# Patient Record
Sex: Male | Born: 2000 | Hispanic: No | Marital: Single | Smoking: Current some day smoker
Health system: Southern US, Community
[De-identification: ages and names within clinical notes are randomized; demographics above are authoritative.]

---
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
## 2021-09-05 ENCOUNTER — Ambulatory Visit
Admission: EM | Admit: 2021-09-05 | Discharge: 2021-09-05 | Disposition: A | Discharge status: Home / Self Care | Payer: Self-pay | Attending: Internal Medicine | Admitting: Internal Medicine

## 2021-09-05 DIAGNOSIS — L700 Acne vulgaris: Secondary | ICD-10-CM

## 2021-09-05 DIAGNOSIS — L509 Urticaria, unspecified: Secondary | ICD-10-CM

## 2021-09-05 MED ORDER — CLINDAMYCIN HCL 150 MG PO CAPS: 450.0000 mg | ORAL_CAPSULE | Freq: Three times a day (TID) | ORAL | 0 refills | Status: AC

## 2021-09-05 NOTE — ED Triage Notes (Signed)
Patient presents to Urgent Care with complaints of itchy all over with rash on arms, neck back and legs since Thursday. Patient reports taking claratin today and about 1 hour after taking medication pt experienced numbness in arms and legs.  ? ?

## 2021-09-05 NOTE — ED Provider Notes (Signed)
?[facility] ? ? ? ?CSN: [id number] ?Arrival date & time: 09/05/21  1349 ? ? ?  ? ?History   ?Chief Complaint ?Chief Complaint  ?Patient presents with  ? Pruritis  ? ? ?HPI ?Robert Hale is a 21 y.o. male.  ? ?Patient presents with itchy rash that is present on bilateral arms, neck, back that started approximately 3 days ago.  Patient has history of body acne and is concerned for a flareup.  Patient took Claritin today with no improvement and also reports that he started having some paresthesias in hands about an hour after taking medication.  Paresthesias have improved.  Denies any changes in the environment including lotions, soaps, detergents, foods, etc.  Patient has also been taking a medication that he bought online called clear stem skin care to help alleviate acne.  He has been followed by dermatology in the past but has not been to them in a while.  Patient states that he has been told that the clear stem skin care is similar to Accutane. ? ? ? ?History reviewed. No pertinent past medical history. ? ?There are no problems to display for this patient. ? ? ?Past Surgical History:  ?Procedure Laterality Date  ? ORIF HUMERUS FRACTURE Left 04/27/2015  ? Procedure: CLOSED REDUCTION AND PERCANTANEOUS PINNING LEFT PROXIMAL HUMERUS;  Surgeon: Beverely LowSteve Norris, MD;  Location: [facility] OR;  Service: Orthopedics;  Laterality: Left;  ? ? ? ? ? ?Home Medications   ? ?Prior to Admission medications   ?Medication Sig Start Date End Date Taking? Authorizing Provider  ?clindamycin (CLEOCIN) 150 MG capsule Take 3 capsules (450 mg total) by mouth 3 (three) times daily for 5 days. 09/05/21 09/10/21 Yes Tynasia Mccaul, Rolly SalterHaley E, FNP  ?benzoyl peroxide 5 % gel Apply topically See admin instructions. Apply to face and back 2 to 3 times daily    [provider]  ?doxycycline (VIBRAMYCIN) 100 MG capsule Take 100 mg by mouth 2 (two) times daily.    [provider]  ?loperamide (IMODIUM) 2 MG capsule Take 1 capsule (2 mg total) by  mouth every 8 (eight) hours as needed for diarrhea or loose stools. 11/14/14   Marcellina MillinGaley, Timothy, MD  ?oxyCODONE-acetaminophen (PERCOCET) 5-325 MG tablet Take 1 tablet by mouth every 4 (four) hours as needed for moderate pain. 04/27/15   Beverely LowNorris, Steve, MD  ? ? ?Family History ?History reviewed. No pertinent family history. ? ?Social History ?Social History  ? ?Tobacco Use  ? Smoking status: Some Days  ?  Types: Cigarettes  ?Substance Use Topics  ? Alcohol use: Yes  ?  Comment: Occassionally  ? Drug use: Not Currently  ?  Types: Marijuana  ? ? ? ?Allergies   ?Patient has no known allergies. ? ? ?Review of Systems ?Review of Systems ?Per HPI ? ?Physical Exam ?Triage Vital Signs ?ED Triage Vitals [09/05/21 1443]  ?Enc Vitals Group  ?   BP (!) 143/77  ?   Pulse Rate 71  ?   Resp 18  ?   Temp 97.9 ?F (36.6 ?C)  ?   Temp Source Oral  ?   SpO2 99 %  ?   Weight   ?   Height   ?   Head Circumference   ?   Peak Flow   ?   Pain Score   ?   Pain Loc   ?   Pain Edu?   ?   Excl. in GC?   ? ?No data found. ? ?Updated Vital  Signs ?BP (!) 143/77 (BP Location: Right Arm)   Pulse 71   Temp 97.9 ?F (36.6 ?C) (Oral)   Resp 18   SpO2 99%  ? ?Visual Acuity ?Right Eye Distance:   ?Left Eye Distance:   ?Bilateral Distance:   ? ?Right Eye Near:   ?Left Eye Near:    ?Bilateral Near:    ? ?Physical Exam ?Constitutional:   ?   General: He is not in acute distress. ?   Appearance: Normal appearance. He is not toxic-appearing or diaphoretic.  ?HENT:  ?   Head: Normocephalic and atraumatic.  ?Eyes:  ?   Extraocular Movements: Extraocular movements intact.  ?   Conjunctiva/sclera: Conjunctivae normal.  ?Cardiovascular:  ?   Rate and Rhythm: Normal rate and regular rhythm.  ?   Pulses: Normal pulses.  ?   Heart sounds: Normal heart sounds.  ?Pulmonary:  ?   Effort: Pulmonary effort is normal. No respiratory distress.  ?   Breath sounds: Normal breath sounds.  ?Musculoskeletal:  ?   Comments: Grip strength 5/5 to bilateral hands.  Neurovascular  intact.  ?Skin: ?   Comments: Pustular papular lesions present throughout right lateral neck, upper back, bilateral upper arms.   ?Neurological:  ?   General: No focal deficit present.  ?   Mental Status: He is alert and oriented to person, place, and time. Mental status is at baseline.  ?   Cranial Nerves: Cranial nerves 2-12 are intact.  ?   Sensory: Sensation is intact.  ?   Motor: Motor function is intact.  ?   Coordination: Coordination is intact.  ?   Gait: Gait is intact.  ?Psychiatric:     ?   Mood and Affect: Mood normal.     ?   Behavior: Behavior normal.     ?   Thought Content: Thought content normal.     ?   Judgment: Judgment normal.  ? ? ? ?UC Treatments / Results  ?Labs ?(all labs ordered are listed, but only abnormal results are displayed) ?Labs Reviewed - No data to display ? ?EKG ? ? ?Radiology ?No results found. ? ?Procedures ?Procedures (including critical care time) ? ?Medications Ordered in UC ?Medications - No data to display ? ?Initial Impression / Assessment and Plan / UC Course  ?I have reviewed the triage vital signs and the nursing notes. ? ?Pertinent labs & imaging results that were available during my care of the patient were reviewed by me and considered in my medical decision making (see chart for details). ? ?  ?It appears the patient be having an acne flareup.  Suspect the paresthesias of bilateral hands is due to Claritin adverse reaction and should resolve once medication has cleared from system.  Although, patient was advised to follow-up if symptoms persist or worsen.  Neuro exam is normal so no concern for emergent evaluation at the hospital at this time given the symptoms.  Further review of clear stem skin care reveals that it does not appear to be related to Accutane but will avoid doxycycline and treat with clindamycin at this time.  Patient advised to follow-up with dermatology for further evaluation and management as well.  Patient verbalized understanding and was  agreeable with plan. ?Final Clinical Impressions(s) / UC Diagnoses  ? ?Final diagnoses:  ?Acne vulgaris  ?Urticaria  ? ? ? ?Discharge Instructions   ? ?  ?You have been prescribed an antibiotic to help alleviate symptoms.  Please stop taking medication that you  have bought on the Internet.  Follow-up with dermatology for further evaluation and management. ? ? ? ?ED Prescriptions   ? ? Medication Sig Dispense Auth. Provider  ? clindamycin (CLEOCIN) 150 MG capsule Take 3 capsules (450 mg total) by mouth 3 (three) times daily for 5 days. 45 capsule Ervin Knack E, [place]  ? ?  ? ?PDMP not reviewed this encounter. ?  ?Gustavus Bryant, [place] ?09/05/21 1526 ? ?

## 2021-09-05 NOTE — Discharge Instructions (Signed)
You have been prescribed an antibiotic to help alleviate symptoms.  Please stop taking medication that you have bought on the Internet.  Follow-up with dermatology for further evaluation and management. ?

## 2022-03-03 ENCOUNTER — Emergency Department (HOSPITAL_COMMUNITY)
Admission: EM | Admit: 2022-03-03 | Discharge: 2022-03-04 | Disposition: A | Discharge status: Home / Self Care | Payer: Medicaid Other | Attending: Emergency Medicine | Admitting: Emergency Medicine

## 2022-03-03 ENCOUNTER — Encounter (HOSPITAL_COMMUNITY): Payer: Self-pay

## 2022-03-03 ENCOUNTER — Emergency Department (HOSPITAL_COMMUNITY): Payer: Medicaid Other

## 2022-03-03 ENCOUNTER — Other Ambulatory Visit: Payer: Self-pay

## 2022-03-03 DIAGNOSIS — M546 Pain in thoracic spine: Secondary | ICD-10-CM | POA: Insufficient documentation

## 2022-03-03 LAB — CBC
HCT: 41 % (ref 39.0–52.0)
Hemoglobin: 13 g/dL (ref 13.0–17.0)
MCH: 26.9 pg (ref 26.0–34.0)
MCHC: 31.7 g/dL (ref 30.0–36.0)
MCV: 84.7 fL (ref 80.0–100.0)
Platelets: 387 10*3/uL (ref 150–400)
RBC: 4.84 MIL/uL (ref 4.22–5.81)
RDW: 13.6 % (ref 11.5–15.5)
WBC: 12.5 10*3/uL — ABNORMAL HIGH (ref 4.0–10.5)
nRBC: 0 % (ref 0.0–0.2)

## 2022-03-03 LAB — BASIC METABOLIC PANEL
Anion gap: 10 (ref 5–15)
BUN: 11 mg/dL (ref 6–20)
CO2: 26 mmol/L (ref 22–32)
Calcium: 9.5 mg/dL (ref 8.9–10.3)
Chloride: 103 mmol/L (ref 98–111)
Creatinine, Ser: 0.85 mg/dL (ref 0.61–1.24)
GFR, Estimated: >60 mL/min (ref >60)
Glucose, Bld: 85 mg/dL (ref 70–99)
Potassium: 3.7 mmol/L (ref 3.5–5.1)
Sodium: 139 mmol/L (ref 135–145)

## 2022-03-03 LAB — TROPONIN I (HIGH SENSITIVITY): Troponin I (High Sensitivity): 2 ng/L (ref <18)

## 2022-03-03 MED ORDER — METHOCARBAMOL 500 MG PO TABS
500.0000 mg | ORAL_TABLET | Freq: Once | ORAL | Status: AC
  Administered 2022-03-04: 500 mg via ORAL
  Filled 2022-03-03: qty 1

## 2022-03-03 MED ORDER — OXYCODONE-ACETAMINOPHEN 5-325 MG PO TABS
1.0000 | ORAL_TABLET | Freq: Once | ORAL | Status: AC
  Administered 2022-03-04: 1 via ORAL
  Filled 2022-03-03: qty 1

## 2022-03-03 NOTE — ED Provider Triage Note (Signed)
Emergency Medicine Provider Triage Evaluation Note  Robert Hale , a 21 y.o. male  was evaluated in triage.  Pt complains of chest pain that radiates to his back for the last 5 days.  Patient also reporting shortness of breath.  Patient reports he works in [employer], [employer].  Patient states that he does do heavy lifting.  Patient reports the pain is worse with movement, relieved with rest.  Patient states he took ibuprofen 3 days ago, it did relieve some of the pain however he has not taken any ibuprofen.  Patient denies any fevers, nausea, vomiting, diarrhea, dysuria.  Review of Systems  Positive:  Negative:   Physical Exam  Ht 5\' 8"  (1.727 m)   Wt 68 kg   BMI 22.81 kg/m  Gen:   Awake, no distress   Resp:  Normal effort  MSK:   Moves extremities without difficulty  Other:    Medical Decision Making  Medically screening exam initiated at 9:40 PM.  Appropriate orders placed.  Robert Hale was informed that the remainder of the evaluation will be completed by another provider, this initial triage assessment does not replace that evaluation, and the importance of remaining in the ED until their evaluation is complete.     Azucena Cecil, PA-C 03/03/22 2142

## 2022-03-03 NOTE — ED Provider Notes (Incomplete)
[facility] EMERGENCY DEPARTMENT Provider Note   CSN: [id number] Arrival date & time: 03/03/22  2126     History {Add pertinent medical, surgical, social history, OB history to HPI:1} Chief Complaint  Patient presents with   Back Pain    Robert Hale is a 21 y.o. male.  The history is provided by the patient and medical records.  Back Pain  21 year old male with no significant past medical history presenting to the ED for back pain.  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  States since that time he has had worsening pain in his thoracic back, worse with movement such as twisting to the side, turning his head, or deep breathing.  He denies any shortness of breath.  Pain is better when sitting still.  He did take some ibuprofen a few days ago which helped his pain but did not completely resolve it.  He has not taken any medication since that time.  He denies any cough, hemoptysis, nausea, vomiting, or diarrhea.  Home Medications Prior to Admission medications   Medication Sig Start Date End Date Taking? Authorizing Provider  benzoyl peroxide 5 % gel Apply topically See admin instructions. Apply to face and back 2 to 3 times daily    [provider]  doxycycline (VIBRAMYCIN) 100 MG capsule Take 100 mg by mouth 2 (two) times daily.    [provider]  loperamide (IMODIUM) 2 MG capsule Take 1 capsule (2 mg total) by mouth every 8 (eight) hours as needed for diarrhea or loose stools. 11/14/14   Isaac Bliss, MD  oxyCODONE-acetaminophen (PERCOCET) 5-325 MG tablet Take 1 tablet by mouth every 4 (four) hours as needed for moderate pain. 04/27/15   Netta Cedars, MD      Allergies    Patient has no known allergies.    Review of Systems   Review of Systems  Musculoskeletal:  Positive for back pain.  All other systems reviewed and are negative.   Physical  Exam Updated Vital Signs BP (!) 146/83 (BP Location: Right Arm)   Pulse 100   Temp 99.4 F (37.4 C) (Oral)   Resp 16   Ht 5\' 8"  (1.727 m)   Wt 68 kg   SpO2 99%   BMI 22.81 kg/m  Physical Exam Vitals and nursing note reviewed.  Constitutional:      Appearance: He is well-developed.  HENT:     Head: Normocephalic and atraumatic.  Eyes:     Conjunctiva/sclera: Conjunctivae normal.     Pupils: Pupils are equal, round, and reactive to light.  Cardiovascular:     Rate and Rhythm: Normal rate and regular rhythm.     Heart sounds: Normal heart sounds.  Pulmonary:     Effort: Pulmonary effort is normal.     Breath sounds: Normal breath sounds.  Abdominal:     General: Bowel sounds are normal.     Palpations: Abdomen is soft.  Musculoskeletal:        General: Normal range of motion.     Cervical back: Normal range of motion.     Comments: Reproducible tenderness to the thoracic back, worse on left side, there is no midline step-off or deformity, normal strength and sensation of all 4 extremities, normal gait  Skin:    General: Skin is warm and dry.  Neurological:     Mental Status: He is alert  and oriented to person, place, and time.     ED Results / Procedures / Treatments   Labs (all labs ordered are listed, but only abnormal results are displayed) Labs Reviewed  CBC - Abnormal; Notable for the following components:      Result Value   WBC 12.5 (*)    All other components within normal limits  BASIC METABOLIC PANEL  TROPONIN I (HIGH SENSITIVITY)  TROPONIN I (HIGH SENSITIVITY)    EKG None  Radiology DG Thoracic Spine 2 View  Result Date: 03/03/2022 CLINICAL DATA:  Back pain EXAM: THORACIC SPINE 2 VIEWS COMPARISON:  None Available. FINDINGS: There is no evidence of thoracic spine fracture. Alignment is normal. No other significant bone abnormalities are identified. IMPRESSION: Negative. Electronically Signed   By: Fidela Salisbury M.D.   On: 03/03/2022 21:55   DG  Chest 2 View  Result Date: 03/03/2022 CLINICAL DATA:  Chest pain EXAM: CHEST - 2 VIEW COMPARISON:  None Available. FINDINGS: The heart size and mediastinal contours are within normal limits. Both lungs are clear. The visualized skeletal structures are unremarkable. IMPRESSION: No active cardiopulmonary disease. Electronically Signed   By: Fidela Salisbury M.D.   On: 03/03/2022 21:55    Procedures Procedures  {Document cardiac monitor, telemetry assessment procedure when appropriate:1}  Medications Ordered in ED Medications  oxyCODONE-acetaminophen (PERCOCET/ROXICET) 5-325 MG per tablet 1 tablet (has no administration in time range)  methocarbamol (ROBAXIN) tablet 500 mg (has no administration in time range)    ED Course/ Medical Decision Making/ A&P                           Medical Decision Making Risk Prescription drug management.   ***  {Document critical care time when appropriate:1} {Document review of labs and clinical decision tools ie heart score, Chads2Vasc2 etc:1}  {Document your independent review of radiology images, and any outside records:1} {Document your discussion with family members, caretakers, and with consultants:1} {Document social determinants of health affecting pt's care:1} {Document your decision making why or why not admission, treatments were needed:1} Final Clinical Impression(s) / ED Diagnoses Final diagnoses:  None    Rx / DC Orders ED Discharge Orders     None

## 2022-03-03 NOTE — ED Triage Notes (Signed)
Mid thoracic back pain x 5 days. Pain somewhat relieved with walking. Worst when laying in bed.

## 2022-03-04 MED ORDER — MELOXICAM 7.5 MG PO TABS: 15.0000 mg | ORAL_TABLET | Freq: Every day | ORAL | 0 refills | Status: DC

## 2022-03-04 MED ORDER — METHOCARBAMOL 500 MG PO TABS: 500.0000 mg | ORAL_TABLET | Freq: Two times a day (BID) | ORAL | 0 refills | Status: AC

## 2022-03-04 NOTE — ED Notes (Signed)
Pt A&Ox4 ambulatory at d/c with independent steady gait. Pt verbalized understanding of d/c instructions, prescriptions and follow up care. 

## 2022-03-04 NOTE — Discharge Instructions (Signed)
Take the prescribed medication as directed. °Follow-up with your primary care doctor. °Return to the ED for new or worsening symptoms. °

## 2023-01-17 ENCOUNTER — Emergency Department (HOSPITAL_COMMUNITY)
Admission: EM | Admit: 2023-01-17 | Discharge: 2023-01-18 | Discharge status: Against Medical Advice | Payer: 59 | Attending: Emergency Medicine | Admitting: Emergency Medicine

## 2023-01-17 ENCOUNTER — Encounter (HOSPITAL_COMMUNITY): Payer: Self-pay

## 2023-01-17 ENCOUNTER — Emergency Department (HOSPITAL_COMMUNITY): Payer: 59

## 2023-01-17 DIAGNOSIS — W1789XA Other fall from one level to another, initial encounter: Secondary | ICD-10-CM | POA: Insufficient documentation

## 2023-01-17 DIAGNOSIS — S4992XA Unspecified injury of left shoulder and upper arm, initial encounter: Secondary | ICD-10-CM | POA: Diagnosis present

## 2023-01-17 DIAGNOSIS — Z5321 Procedure and treatment not carried out due to patient leaving prior to being seen by health care provider: Secondary | ICD-10-CM | POA: Diagnosis not present

## 2023-01-17 NOTE — ED Notes (Signed)
Pt left due to wait time
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
## 2023-01-18 ENCOUNTER — Encounter (HOSPITAL_COMMUNITY): Payer: Self-pay | Admitting: Emergency Medicine

## 2023-01-18 ENCOUNTER — Ambulatory Visit (HOSPITAL_COMMUNITY)
Admission: EM | Admit: 2023-01-18 | Discharge: 2023-01-18 | Disposition: A | Discharge status: Home / Self Care | Payer: Medicaid Other | Attending: Family Medicine | Admitting: Family Medicine

## 2023-01-18 ENCOUNTER — Other Ambulatory Visit: Payer: Self-pay

## 2023-01-18 DIAGNOSIS — M25512 Pain in left shoulder: Secondary | ICD-10-CM

## 2023-01-18 MED ORDER — HYDROCODONE-ACETAMINOPHEN 5-325 MG PO TABS
1.0000 | ORAL_TABLET | Freq: Four times a day (QID) | ORAL | 0 refills | Status: AC | PRN
Start: 2023-01-18 — End: ?

## 2023-01-18 MED ORDER — DICLOFENAC SODIUM 75 MG PO TBEC: 75.0000 mg | DELAYED_RELEASE_TABLET | Freq: Two times a day (BID) | ORAL | 0 refills | Status: AC
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
## 2023-01-18 NOTE — Discharge Instructions (Signed)
Be aware, you have been prescribed pain medications that may cause drowsiness. While taking this medication, do not take any other medications containing acetaminophen (Tylenol). Do not combine with alcohol or recreational drugs. Please do not drive, operate heavy machinery, or take part in activities that require making important decisions while on this medication as your judgement may be clouded.
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
